# Patient Record
Sex: Male | Born: 1975 | Race: White | Hispanic: No | Marital: Married | Smoking: Current every day smoker
Health system: Southern US, Community
[De-identification: ages and names within clinical notes are randomized; demographics above are authoritative.]

## PROBLEM LIST (undated history)

## (undated) DIAGNOSIS — M329 Systemic lupus erythematosus, unspecified: Secondary | ICD-10-CM

## (undated) DIAGNOSIS — IMO0002 Reserved for concepts with insufficient information to code with codable children: Secondary | ICD-10-CM

## (undated) HISTORY — DX: Systemic lupus erythematosus, unspecified: M32.9

---

## 2004-12-25 ENCOUNTER — Emergency Department (HOSPITAL_COMMUNITY): Admission: EM | Admit: 2004-12-25 | Discharge: 2004-12-25 | Payer: Self-pay | Admitting: Emergency Medicine

## 2006-04-18 IMAGING — CR DG HAND COMPLETE 3+V*L*
3 series · 3 of 3 positions shown · non-contrast
Comparison: none

CLINICAL DATA: Left hand injury with pain of the 5th metacarpal.  
 LEFT HAND ? THREE VIEW:
 Three views of the left hand were obtained.  Bone density is within normal limits.  There is a comminuted fracture of the proximal aspect of the 5th metacarpal with associated intraarticular extension.  Minimal displacement of the medially positioned fracture fragment is noted.  Associated swelling of the soft tissues of the hand is apparent.  The remaining bony structures are intact.

[x hand ap left]
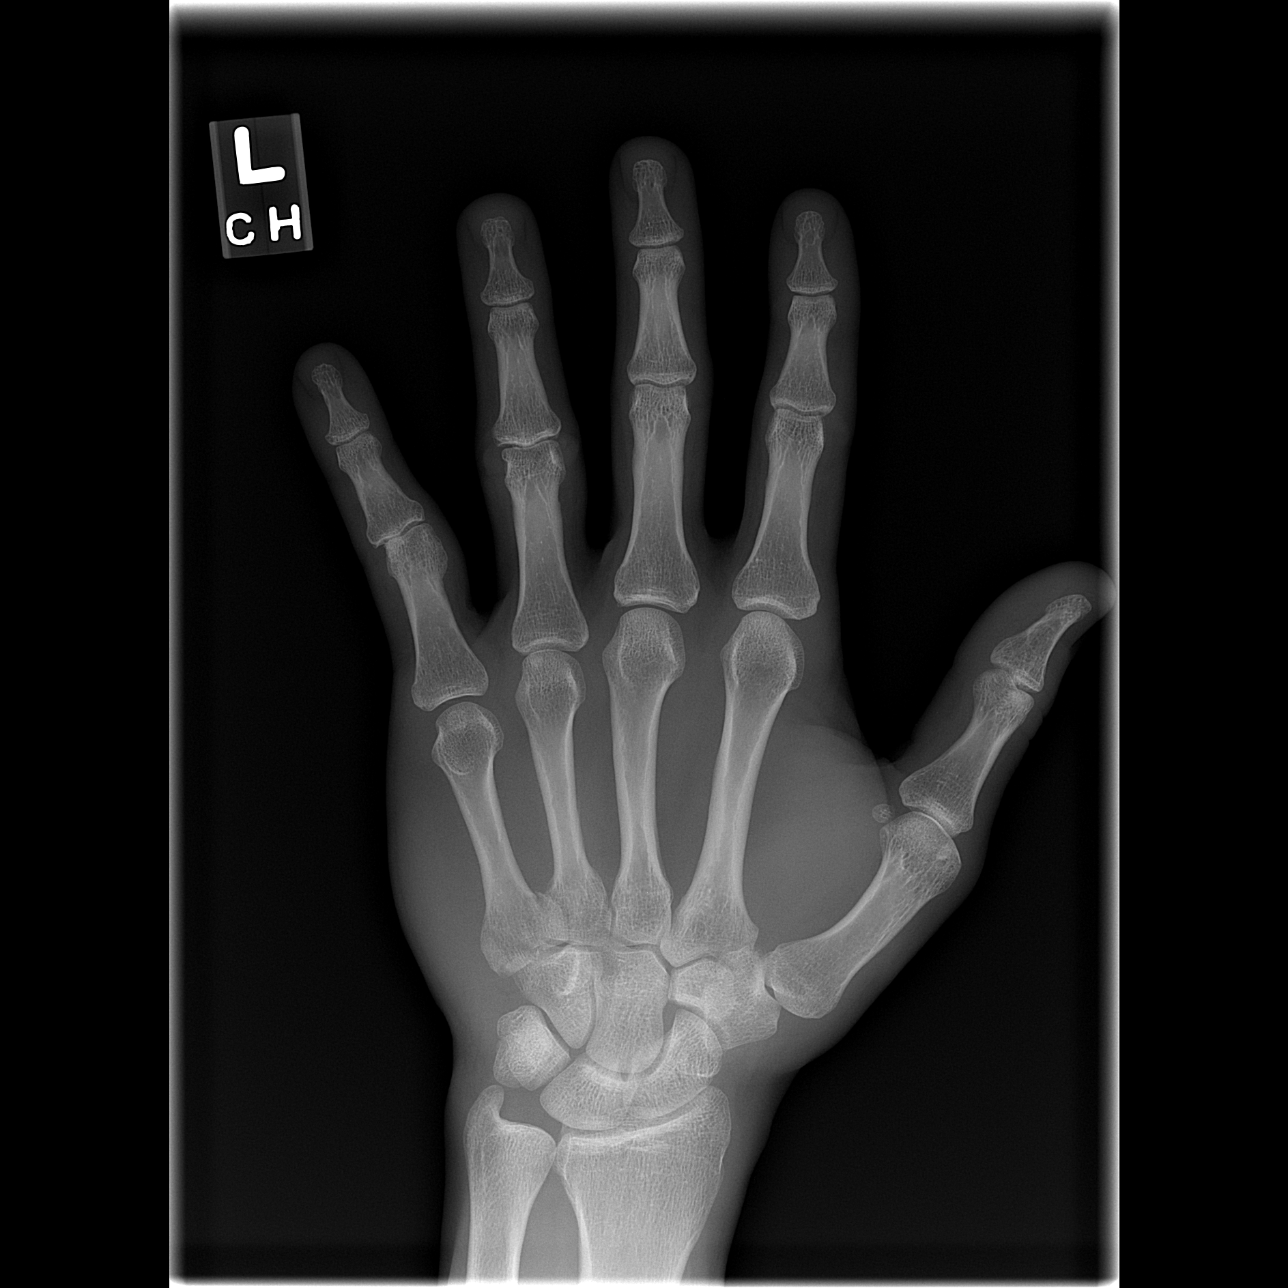

[x hand oblique left]
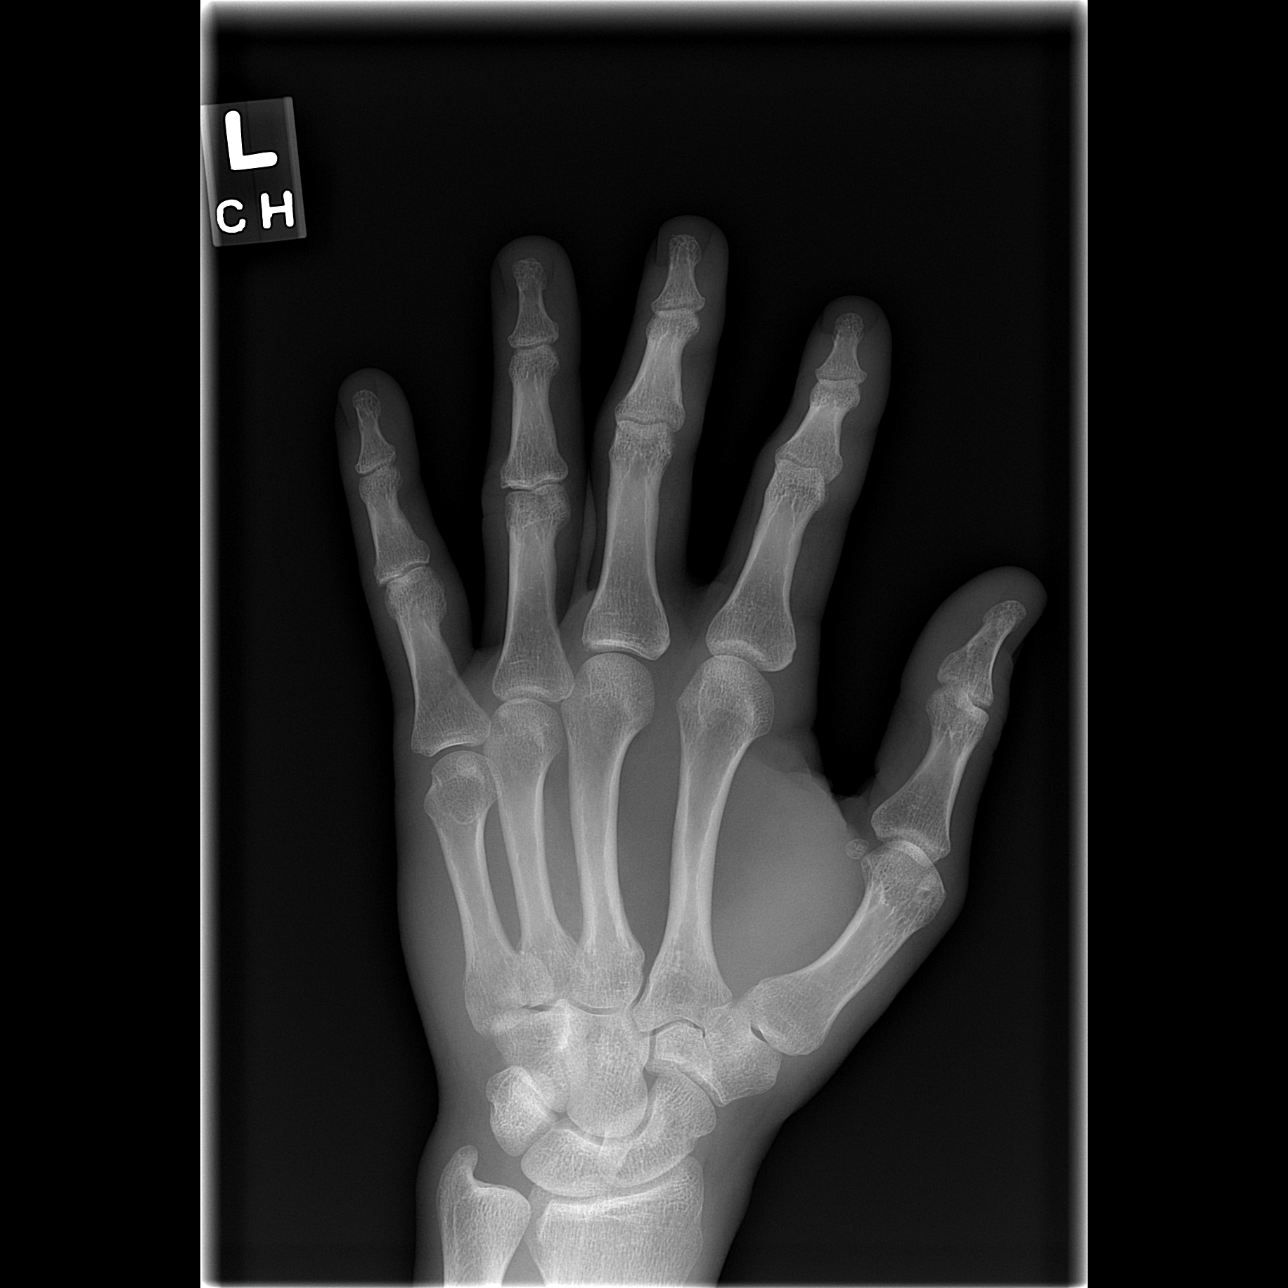

[x hand lat left]
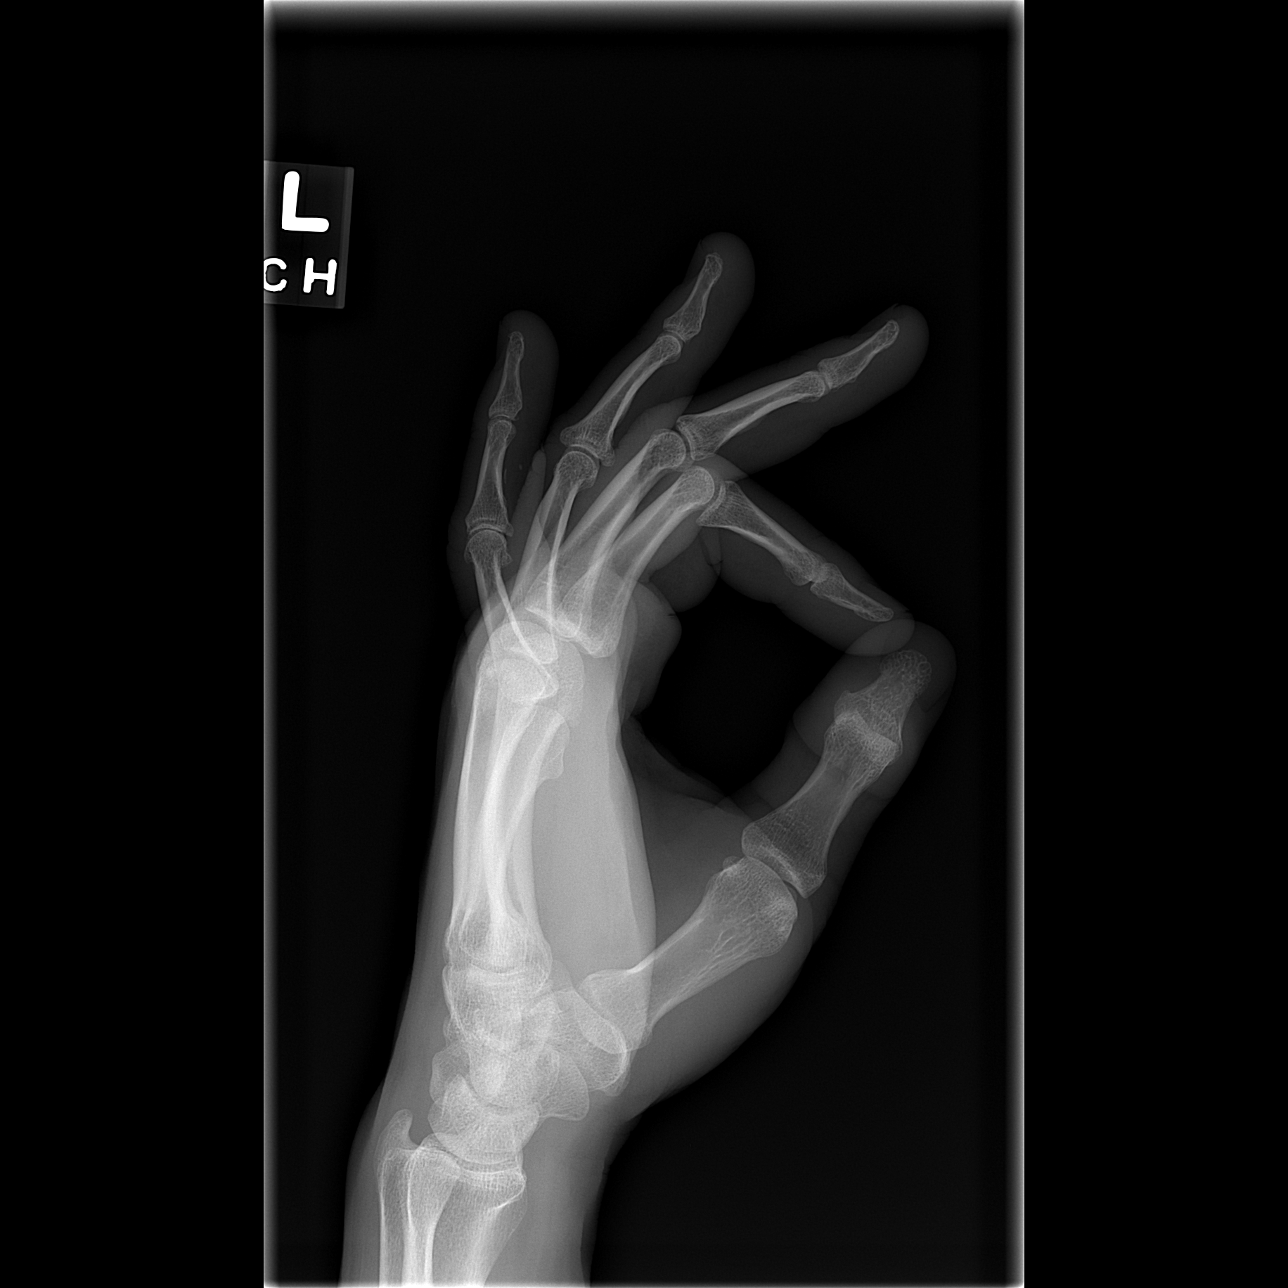

[3 of 3 positions shown; findings below may reference images not displayed]

IMPRESSION: Comminuted fracture of the proximal aspect of the left 5th metacarpal as described above.

## 2022-02-20 ENCOUNTER — Encounter (HOSPITAL_COMMUNITY): Payer: Self-pay

## 2022-02-20 ENCOUNTER — Other Ambulatory Visit: Payer: Self-pay

## 2022-02-20 ENCOUNTER — Emergency Department (HOSPITAL_COMMUNITY)
Admission: EM | Admit: 2022-02-20 | Discharge: 2022-02-20 | Disposition: A | Discharge status: Home / Self Care | Payer: No Typology Code available for payment source | Attending: Emergency Medicine | Admitting: Emergency Medicine

## 2022-02-20 DIAGNOSIS — S92012A Displaced fracture of body of left calcaneus, initial encounter for closed fracture: Secondary | ICD-10-CM | POA: Insufficient documentation

## 2022-02-20 DIAGNOSIS — W11XXXA Fall on and from ladder, initial encounter: Secondary | ICD-10-CM | POA: Insufficient documentation

## 2022-02-20 DIAGNOSIS — S92002A Unspecified fracture of left calcaneus, initial encounter for closed fracture: Secondary | ICD-10-CM

## 2022-02-20 DIAGNOSIS — S99922A Unspecified injury of left foot, initial encounter: Secondary | ICD-10-CM | POA: Diagnosis present

## 2022-02-20 HISTORY — DX: Reserved for concepts with insufficient information to code with codable children: IMO0002

## 2022-02-20 MED ORDER — HYDROCODONE-ACETAMINOPHEN 5-325 MG PO TABS
1.0000 | ORAL_TABLET | Freq: Once | ORAL | Status: AC
  Administered 2022-02-20: 1 via ORAL
  Filled 2022-02-20: qty 1

## 2022-02-20 MED ORDER — HYDROCODONE-ACETAMINOPHEN 5-325 MG PO TABS: 1.0000 | ORAL_TABLET | ORAL | 0 refills | Status: AC | PRN

## 2022-02-20 NOTE — ED Notes (Signed)
Ortho called 

## 2022-02-20 NOTE — ED Triage Notes (Addendum)
Patient fell off step stool and went to [place] in [place] who did xray and CT and patient has a calcanus fx that the [place] dx and patient is possibly needing surgery and ortho consult.

## 2022-02-20 NOTE — Progress Notes (Signed)
Orthopedic Tech Progress Note Patient Details:  Dennis Alexander 1976-06-10 [id number]  Ortho Devices Type of Ortho Device: Short leg splint Ortho Device/Splint Location: LLE Ortho Device/Splint Interventions: Ordered, Application   Post Interventions Patient Tolerated: Well Instructions Provided: Care of device, Poper ambulation with device  Dennis Alexander 02/20/2022, 4:25 PM

## 2022-02-20 NOTE — ED Provider Notes (Cosign Needed Addendum)
[facility] EMERGENCY DEPARTMENT Provider Note   CSN: [id number] Arrival date & time: 02/20/22  1459     History  Chief Complaint  Patient presents with   Foot Injury    Dennis Alexander is a 46 y.o. male.  46 year old male presents today for evaluation of left heel pain that started around 9 AM this morning.  Patient jumped off of a barstool.  Immediately felt pain.  He was evaluated at [facility] and had x-ray, and CT done which showed evidence of fracture.  Patient was referred to emergency room for further evaluation.  He has crutches that were provided to him earlier today.  He is not able to bear weight.  The history is provided by the patient. No language interpreter was used.       Home Medications Prior to Admission medications   Not on File      Allergies    Patient has no known allergies.    Review of Systems   Review of Systems  Musculoskeletal:  Positive for arthralgias and joint swelling.  All other systems reviewed and are negative.   Physical Exam Updated Vital Signs BP 139/87 (BP Location: Right Arm)   Pulse (!) 106   Temp 98.7 F (37.1 C) (Oral)   Resp 18   Ht 5\' 9"  (1.753 m)   Wt 81.6 kg   SpO2 97%   BMI 26.58 kg/m  Physical Exam Vitals and nursing note reviewed.  Constitutional:      General: He is not in acute distress.    Appearance: Normal appearance. He is not ill-appearing.  HENT:     Head: Normocephalic and atraumatic.     Nose: Nose normal.  Eyes:     Conjunctiva/sclera: Conjunctivae normal.  Pulmonary:     Effort: Pulmonary effort is normal. No respiratory distress.  Musculoskeletal:        General: Swelling and tenderness present. No deformity.     Comments: Limited range of motion in left ankle secondary to pain.  Tenderness to palpation present.  Homan sign intact.  2+ DP pulse present.  Neurovascularly intact.  Otherwise full range of motion the left knee, left hip.  Right lower extremity with full range of motion  in all major joints. Cervical, thoracic, lumbar spine without tenderness to palpation.    Skin:    Findings: No rash.  Neurological:     Mental Status: He is alert.     ED Results / Procedures / Treatments   Labs (all labs ordered are listed, but only abnormal results are displayed) Labs Reviewed - No data to display  EKG None  Radiology No results found.  Procedures Procedures    Medications Ordered in ED Medications  HYDROcodone-acetaminophen (NORCO/VICODIN) 5-325 MG per tablet 1 tablet (has no administration in time range)    ED Course/ Medical Decision Making/ A&P                           Medical Decision Making Risk Prescription drug management.   Medical Decision Making / ED Course   This patient presents to the ED for concern of left heel pain, this involves an extensive number of treatment options, and is a complaint that carries with it a high risk of complications and morbidity.  The differential diagnosis includes calcaneus fracture, Achilles tendon rupture  MDM: 46 year old male presents for evaluation of left heel pain.  He was evaluated at [facility] clinic in [place] and  had CT calcaneus performed which show evidence of extra-articular calcaneal fracture without intra-articular involvement.  I have attached an image of the read above.  Case was discussed with Dennis Hamburg, PA-C with the read on CT scan that patient brought with him from the [facility] clinic (I have attached a copy of the CT read below).  It demonstrates mildly displaced calcaneal fracture that is extra-articular without intra-articular extension.  Dennis Needle PA-C recommends bulky Jones splint, nonweightbearing, and follow-up with Dr. Carola Frost next week.  We will provide patient with pain medication.  Patient is appropriate for discharge.  Patient voices understanding and is in agreement with plan.  Return precautions discussed. Splint placed.  Neurovascularly intact.     Lab Tests: -I ordered,  reviewed, and interpreted labs.   The pertinent results include:   Labs Reviewed - No data to display    EKG  EKG Interpretation  Date/Time:    Ventricular Rate:    PR Interval:    QRS Duration:   QT Interval:    QTC Calculation:   R Axis:     Text Interpretation:          Medicines ordered and prescription drug management: Meds ordered this encounter  Medications   HYDROcodone-acetaminophen (NORCO/VICODIN) 5-325 MG per tablet 1 tablet    -I have reviewed the patients home medicines and have made adjustments as needed  Consultations Obtained: I requested consultation with the Dennis Hamburg, PA-C with orthopedic group,  and discussed lab and imaging findings as well as pertinent plan - they recommend: Bulky Jones splint, nonweightbearing, and follow-up outpatient next week  Reevaluation: After the interventions noted above, I reevaluated the patient and found that they have :improved  Co morbidities that complicate the patient evaluation  Past Medical History:  Diagnosis Date   Lupus (HCC)       Dispostion: Patient is appropriate for discharge.  Discharged in stable condition.  Return precautions discussed.   Final Clinical Impression(s) / ED Diagnoses Final diagnoses:  Closed displaced fracture of left calcaneus, unspecified portion of calcaneus, initial encounter    Rx / DC Orders ED Discharge Orders          Ordered    HYDROcodone-acetaminophen (NORCO/VICODIN) 5-325 MG tablet  Every 4 hours PRN        02/20/22 1635              Dennis [place], PA-C 02/20/22 1635    [address] PA-C 02/20/22 1649    Dennis Ade, MD 02/21/22 [phone]

## 2022-02-20 NOTE — Discharge Instructions (Addendum)
I discussed your case with the orthopedic group.  They recommend splinting, and non weightbearing until you follow-up in clinic next week.  I have given the information for Dr. Carola Frost who is an orthopedic surgeon.  Please schedule an appointment for next week.  If you have worsening or concerning symptoms return to emergency room for evaluation.  I have sent pain medication into the pharmacy for you.  Recommend you take Tylenol and ibuprofen and reserve the pain medication for severe or breakthrough pain
